# Patient Record
Sex: Female | Born: 1975 | Hispanic: Yes | Marital: Married | Smoking: Never smoker
Health system: Southern US, Community
[De-identification: ages and names within clinical notes are randomized; demographics above are authoritative.]

## PROBLEM LIST (undated history)

## (undated) HISTORY — PX: BREAST SURGERY: SHX581

## (undated) HISTORY — PX: TUBAL LIGATION: SHX77

---

## 2015-05-28 DIAGNOSIS — Z531 Procedure and treatment not carried out because of patient's decision for reasons of belief and group pressure: Secondary | ICD-10-CM | POA: Insufficient documentation

## 2015-05-28 DIAGNOSIS — IMO0001 Reserved for inherently not codable concepts without codable children: Secondary | ICD-10-CM | POA: Insufficient documentation

## 2016-02-12 ENCOUNTER — Ambulatory Visit
Admission: EM | Admit: 2016-02-12 | Discharge: 2016-02-12 | Disposition: A | Discharge status: Home / Self Care | Payer: BLUE CROSS/BLUE SHIELD | Attending: Family Medicine | Admitting: Family Medicine

## 2016-02-12 ENCOUNTER — Encounter: Payer: Self-pay | Admitting: Emergency Medicine

## 2016-02-12 DIAGNOSIS — N390 Urinary tract infection, site not specified: Secondary | ICD-10-CM | POA: Diagnosis not present

## 2016-02-12 LAB — URINALYSIS COMPLETE WITH MICROSCOPIC (ARMC ONLY): Squamous Epithelial / LPF: NONE SEEN

## 2016-02-12 MED ORDER — PHENAZOPYRIDINE HCL 200 MG PO TABS
200.0000 mg | ORAL_TABLET | Freq: Once | ORAL | Status: AC
  Administered 2016-02-12: 200 mg via ORAL

## 2016-02-12 MED ORDER — HYDROCODONE-ACETAMINOPHEN 5-325 MG PO TABS: 1.0000 | ORAL_TABLET | Freq: Four times a day (QID) | ORAL | 0 refills | Status: DC | PRN

## 2016-02-12 MED ORDER — CIPROFLOXACIN HCL 500 MG PO TABS
500.0000 mg | ORAL_TABLET | Freq: Once | ORAL | Status: AC
  Administered 2016-02-12: 500 mg via ORAL

## 2016-02-12 MED ORDER — CIPROFLOXACIN HCL 500 MG PO TABS: 500.0000 mg | ORAL_TABLET | Freq: Two times a day (BID) | ORAL | 0 refills | Status: DC

## 2016-02-12 NOTE — Discharge Instructions (Signed)
Pyridium over the counter as needed up to 3 times daily  Drink lots of water

## 2016-02-12 NOTE — ED Triage Notes (Signed)
Patient c/o burning when urinating and lower abdominal pain that started yesterday.

## 2016-02-12 NOTE — ED Provider Notes (Signed)
[facility]    CSN: [id number] Arrival date & time: 02/12/16  1344  First Provider Contact:  None       History   Chief Complaint Chief Complaint  Patient presents with  . Dysuria  . Abdominal Pain    HPI Sonya Rush is a 40 y.o. female.   The history is provided by the patient.  Dysuria  Pain quality:  Burning Pain severity:  Moderate Onset quality:  Sudden Duration:  2 days Timing:  Constant Progression:  Worsening Chronicity:  New Recent urinary tract infections: no   Relieved by:  Nothing Urinary symptoms: discolored urine and frequent urination   Urinary symptoms: no foul-smelling urine, no hematuria, no hesitancy and no bladder incontinence   Associated symptoms: abdominal pain (suprapubic)   Associated symptoms: no fever, no flank pain, no genital lesions, no nausea, no vaginal discharge and no vomiting   Risk factors: no hx of pyelonephritis, no hx of urolithiasis, no kidney transplant, not pregnant, no recurrent urinary tract infections, no renal cysts, no renal disease, not sexually active, no sexually transmitted infections, no single kidney and no urinary catheter     History reviewed. No pertinent past medical history.  There are no active problems to display for this patient.   Past Surgical History:  Procedure Laterality Date  . BREAST SURGERY    . TUBAL LIGATION      OB History    No data available       Home Medications    Prior to Admission medications   Medication Sig Start Date End Date Taking? Authorizing Provider  ciprofloxacin (CIPRO) 500 MG tablet Take 1 tablet (500 mg total) by mouth every 12 (twelve) hours. 02/12/16   Payton Mccallum, MD  HYDROcodone-acetaminophen (NORCO/VICODIN) 5-325 MG tablet Take 1-2 tablets by mouth every 6 (six) hours as needed. 02/12/16   Payton Mccallum, MD    Family History History reviewed. No pertinent family history.  Social History Social History  Substance Use Topics  . Smoking  status: Never Smoker  . Smokeless tobacco: Never Used  . Alcohol use Yes     Allergies   Iodine   Review of Systems Review of Systems  Constitutional: Negative for fever.  Gastrointestinal: Positive for abdominal pain (suprapubic). Negative for nausea and vomiting.  Genitourinary: Positive for dysuria. Negative for flank pain and vaginal discharge.     Physical Exam Triage Vital Signs ED Triage Vitals  Enc Vitals Group     BP 02/12/16 1431 (!) 180/100     Pulse Rate 02/12/16 1431 (!) 109     Resp 02/12/16 1431 16     Temp 02/12/16 1431 97.7 F (36.5 C)     Temp Source 02/12/16 1431 Tympanic     SpO2 02/12/16 1431 100 %     Weight 02/12/16 1431 148 lb (67.1 kg)     Height 02/12/16 1431 5\' 2"  (1.575 m)     Head Circumference --      Peak Flow --      Pain Score 02/12/16 1433 8     Pain Loc --      Pain Edu? --      Excl. in GC? --    No data found.   Updated Vital Signs BP 128/79 (BP Location: Left Arm)   Pulse 94   Temp 97.7 F (36.5 C) (Tympanic)   Resp 16   Ht 5\' 2"  (1.575 m)   Wt 148 lb (67.1 kg)   LMP 02/07/2016 (Exact  Date)   SpO2 100%   BMI 27.07 kg/m   Visual Acuity Right Eye Distance:   Left Eye Distance:   Bilateral Distance:    Right Eye Near:   Left Eye Near:    Bilateral Near:     Physical Exam  Constitutional: She appears well-developed and well-nourished. No distress.  Abdominal: Soft. Bowel sounds are normal. She exhibits no distension and no mass. There is tenderness (mild suprapubic ). There is no rebound and no guarding.  Skin: She is not diaphoretic.  Nursing note and vitals reviewed.    UC Treatments / Results  Labs (all labs ordered are listed, but only abnormal results are displayed) Labs Reviewed  URINE CULTURE - Abnormal; Notable for the following:       Result Value   Culture   (*)    Value: >=100,000 COLONIES/mL ESCHERICHIA COLI SUSCEPTIBILITIES TO FOLLOW Performed at [facility]    All other components  within normal limits  URINALYSIS COMPLETEWITH MICROSCOPIC ([facility] ONLY) - Abnormal; Notable for the following:    Color, Urine RED (*)    APPearance CLOUDY (*)    Glucose, UA   (*)    Value: TEST NOT REPORTED DUE TO COLOR INTERFERENCE OF URINE PIGMENT   Bilirubin Urine   (*)    Value: TEST NOT REPORTED DUE TO COLOR INTERFERENCE OF URINE PIGMENT   Ketones, ur   (*)    Value: TEST NOT REPORTED DUE TO COLOR INTERFERENCE OF URINE PIGMENT   Hgb urine dipstick   (*)    Value: TEST NOT REPORTED DUE TO COLOR INTERFERENCE OF URINE PIGMENT   Protein, ur   (*)    Value: TEST NOT REPORTED DUE TO COLOR INTERFERENCE OF URINE PIGMENT   Nitrite   (*)    Value: TEST NOT REPORTED DUE TO COLOR INTERFERENCE OF URINE PIGMENT   Leukocytes, UA   (*)    Value: TEST NOT REPORTED DUE TO COLOR INTERFERENCE OF URINE PIGMENT   Bacteria, UA RARE (*)    All other components within normal limits    EKG  EKG Interpretation None       Radiology No results found.  Procedures Procedures (including critical care time)  Medications Ordered in UC Medications  ciprofloxacin (CIPRO) tablet 500 mg (500 mg Oral Given 02/12/16 1557)  phenazopyridine (PYRIDIUM) tablet 200 mg (200 mg Oral Given 02/12/16 1558)     Initial Impression / Assessment and Plan / UC Course  I have reviewed the triage vital signs and the nursing notes.  Pertinent labs & imaging results that were available during my care of the patient were reviewed by me and considered in my medical decision making (see chart for details).  Clinical Course      Final Clinical Impressions(s) / UC Diagnoses   Final diagnoses:  UTI (lower urinary tract infection)    New Prescriptions Discharge Medication List as of 02/12/2016  4:09 PM    START taking these medications   Details  ciprofloxacin (CIPRO) 500 MG tablet Take 1 tablet (500 mg total) by mouth every 12 (twelve) hours., Starting Sun 02/12/2016, Normal    HYDROcodone-acetaminophen  (NORCO/VICODIN) 5-325 MG tablet Take 1-2 tablets by mouth every 6 (six) hours as needed., Starting Sun 02/12/2016, Print       1. diagnosis reviewed with patient 2. rx as per orders above; reviewed possible side effects, interactions, risks and benefits  3. Recommend supportive treatment with increased water 4. Follow-up prn if symptoms worsen or don't  improve   Payton Mccallumrlando Jquan Egelston, MD 02/14/16 2156

## 2016-02-14 ENCOUNTER — Telehealth: Payer: Self-pay

## 2016-02-14 NOTE — Telephone Encounter (Signed)
Courtesy call back completed today after patients visit at [facility]. Patient improved and will follow up with their PCP if symptoms continue or worsen.   

## 2016-02-15 LAB — URINE CULTURE

## 2018-12-04 ENCOUNTER — Other Ambulatory Visit: Payer: Self-pay

## 2018-12-04 DIAGNOSIS — Z20822 Contact with and (suspected) exposure to covid-19: Secondary | ICD-10-CM

## 2018-12-08 LAB — NOVEL CORONAVIRUS, NAA: SARS-CoV-2, NAA: NOT DETECTED

## 2019-08-15 DIAGNOSIS — U071 COVID-19: Secondary | ICD-10-CM

## 2019-08-15 HISTORY — DX: COVID-19: U07.1

## 2019-08-20 ENCOUNTER — Other Ambulatory Visit: Payer: Self-pay

## 2019-08-20 ENCOUNTER — Ambulatory Visit
Admission: EM | Admit: 2019-08-20 | Discharge: 2019-08-20 | Disposition: A | Discharge status: Home / Self Care | Payer: BC Managed Care – PPO

## 2019-08-20 ENCOUNTER — Ambulatory Visit (INDEPENDENT_AMBULATORY_CARE_PROVIDER_SITE_OTHER): Payer: BC Managed Care – PPO

## 2019-08-20 DIAGNOSIS — J1282 Pneumonia due to coronavirus disease 2019: Secondary | ICD-10-CM

## 2019-08-20 DIAGNOSIS — R05 Cough: Secondary | ICD-10-CM | POA: Diagnosis not present

## 2019-08-20 DIAGNOSIS — U071 COVID-19: Secondary | ICD-10-CM | POA: Diagnosis not present

## 2019-08-20 MED ORDER — HYDROCOD POLST-CPM POLST ER 10-8 MG/5ML PO SUER: 5.0000 mL | Freq: Two times a day (BID) | ORAL | 0 refills | Status: AC | PRN

## 2019-08-20 MED ORDER — IBUPROFEN 800 MG PO TABS
800.0000 mg | ORAL_TABLET | Freq: Once | ORAL | Status: AC
  Administered 2019-08-20: 16:00:00 800 mg via ORAL

## 2019-08-20 MED ORDER — PREDNISONE 50 MG PO TABS: 50.0000 mg | ORAL_TABLET | Freq: Every day | ORAL | 0 refills | Status: AC

## 2019-08-20 MED ORDER — ONDANSETRON 4 MG PO TBDP: 4.0000 mg | ORAL_TABLET | Freq: Three times a day (TID) | ORAL | 0 refills | Status: AC | PRN

## 2019-08-20 NOTE — Discharge Instructions (Addendum)
It was very nice seeing you today in clinic. Thank you for entrusting me with your care.   REST and Stay HYDRATED. Water and electrolyte containing beverages (Gatorade, Pedialyte) are best to prevent dehydration and electrolyte abnormalities. Make efforts to remain active. Deep breathe often to prevent worsening of your pneumonia. Use steroids, cough medication, and nausea medications as prescribed. Remain on quarantine as previously directed.   Make arrangements to follow up with your regular doctor in 1 week for re-evaluation if not improving. If your symptoms/condition worsens, please seek follow up care either here or in the ER. Please remember, our [facility] providers are "right here with you" when you need [place].   Again, it was my pleasure to take care of you today. Thank you for choosing our clinic. I hope that you start to feel better quickly.   Quentin Mulling, MSN, APRN, FNP-C, CEN Advanced Practice Provider  [facility]

## 2019-08-20 NOTE — ED Triage Notes (Signed)
Pt presents with c/o recent positive COVID (08/15/19), she was tested at a drive-thru facility. Pt has c/o cough, body aches, headache and trouble taking deep breaths. Pt is concerned about pna.

## 2019-08-21 NOTE — ED Provider Notes (Signed)
[place], [place]   Name: Sonya Rush DOB: 05-Nov-1975 MRN: [id number] CSN: [id number] PCP: Patient, No Pcp Per  Arrival date and time:  08/20/19 1450  Chief Complaint:  Cough and Shortness of Breath  NOTE: Prior to seeing the patient today, I have reviewed the triage nursing documentation and vital signs. Clinical staff has updated patient's PMH/PSHx, current medication list, and drug allergies/intolerances to ensure comprehensive history available to assist in medical decision making.   History:   HPI: Sonya Rush is a 44 y.o. female who presents today with complaints of worsening cough and shortness of breath after testing positive for SARS-CoV-2 (novel coronavirus) on 08/15/2019. Patient was tested at a drive-thru facility in [place], [place]. Since her diagnosis, patient has experienced the typical symptom constellation seen with this virus. She complaints of fatigue, cough, generalized myalgias, nausea, vomiting, and diarrhea. Patient notes that her cough is intermittently productive of yellow sputum. She denies any associated wheezing. Patient confirms altered sense of both taste and smell perception; anosmia and ageusia. She has had subjective fevers; has not measure temperature at home. She presents to urgent care today febrile to a Tmax of 102.2. Patient presents today reporting that her cough and shortness of breath have markedly increased since the onset of her symptoms. She is not sleeping. Patient feels as if she is having difficulties taking a deep breath. Oxygen saturations 97% on RA today in clinic. Despite her symptoms, patient has not taken any over the counter interventions to help improve/relieve her reported symptoms at home.   Past Medical History:  Diagnosis Date  . COVID-19 08/15/2019    Past Surgical History:  Procedure Laterality Date  . BREAST SURGERY    . TUBAL LIGATION      Family History  Problem Relation Age of Onset  . Healthy Mother   . Stroke Father      Social History   Tobacco Use  . Smoking status: Never Smoker  . Smokeless tobacco: Never Used  Substance Use Topics  . Alcohol use: Yes  . Drug use: No    Patient Active Problem List   Diagnosis Date Noted  . Refusal of blood transfusions as patient is Jehovah's Witness 05/28/2015    Home Medications:    Current Meds  Medication Sig  . acetaminophen (TYLENOL) 500 MG tablet Take 500 mg by mouth every 6 (six) hours as needed.    Allergies:   Iodine  Review of Systems (ROS):  Review of systems NEGATIVE unless otherwise noted in narrative H&P section.   Vital Signs: Today's Vitals   08/20/19 1507 08/20/19 1508 08/20/19 1602  BP:  117/70   Pulse:  99   Resp:  18   Temp:  (!) 102.2 F (39 C)   TempSrc:  Oral   SpO2:  97%   Weight:  150 lb (68 kg)   Height:  5\' 2"  (1.575 m)   PainSc: 8   8     Physical Exam: Physical Exam  Constitutional: She is oriented to person, place, and time and well-developed, well-nourished, and in no distress.  Acutely ill appearing; fatigued/listless.  HENT:  Head: Normocephalic and atraumatic.  Eyes: Pupils are equal, round, and reactive to light.  Neck: No tracheal deviation present.  Cardiovascular: Normal rate, regular rhythm, normal heart sounds and intact distal pulses.  Pulmonary/Chest: Effort normal. She has decreased breath sounds (throughout). She has wheezes (slight expiratory). She has rhonchi (upper airways; clears with cough).  Significant cough noted in clinic. No SOB or  increased WOB. No distress. Able to speak in complete sentences without difficulties. SPO2 97% on RA.  Abdominal: Soft. Normal appearance and bowel sounds are normal. She exhibits no distension. There is no abdominal tenderness.  Musculoskeletal:     Cervical back: Normal range of motion and neck supple.  Neurological: She is alert and oriented to person, place, and time. Gait normal.  Skin: Skin is warm and dry. No rash noted. She is not diaphoretic.   Psychiatric: Memory, affect and judgment normal. Her mood appears anxious.  Nursing note and vitals reviewed.   Urgent Care Treatments / Results:   Orders Placed This Encounter  Procedures  . DG Chest 2 View    LABS: PLEASE NOTE: all labs that were ordered this encounter are listed, however only abnormal results are displayed. Labs Reviewed - No data to display  EKG: -None  RADIOLOGY: DG Chest 2 View  Result Date: 08/20/2019 CLINICAL DATA:  COVID pneumonia EXAM: CHEST - 2 VIEW COMPARISON:  None. FINDINGS: There is scattered bilateral hazy airspace opacities. There is no pneumothorax or significant pleural effusion. The heart size is normal. There is no acute osseous abnormality. IMPRESSION: Scattered hazy bilateral airspace opacities which can be seen in patients with an atypical infectious process such as viral pneumonia. Electronically Signed   By: Katherine Mantle M.D.   On: 08/20/2019 15:43    PROCEDURES: Procedures  MEDICATIONS RECEIVED THIS VISIT: Medications  ibuprofen (ADVIL) tablet 800 mg (800 mg Oral Given 08/20/19 1601)    PERTINENT CLINICAL COURSE NOTES/UPDATES:   Initial Impression / Assessment and Plan / Urgent Care Course:  Pertinent labs & imaging results that were available during my care of the patient were personally reviewed by me and considered in my medical decision making (see lab/imaging section of note for values and interpretations).  Sonya Rush is a 44 y.o. female who presents to [facility] today with complaints of Cough and Shortness of Breath  Patient acutely ill appearing (non-toxic) appearing in clinic today. She does not appear to be in any acute distress. She does not appear to be in any acute distress. Presenting symptoms (see HPI) and exam as documented above. Patient diagnosed with SARS-CoV-2 (novel coronavirus) on 08/15/2019. She presents today febrile with reports of worsening cough and shortness of breath. She has not been  employing any conservative OTC interventions at home. Will proceed as follows:   Febrile to 102.w in clinic. Complains of body aches and headache. Patient treated with 800 mg IBU.    Radiographs of the chest performed today revealed scattered BILATERAL opacities. Given current viral infection, the findings are consistent with SARS-CoV-2 pneumonia.   Discussed radiographs with patient. I discussed with her that her symptoms are secondary to a pneumonia that is viral in nature, thus antibiotics would not offer her any relief or improve her symptoms any faster than conservative symptomatic management. Patient does not have any significant PMH that would qualify her the monoclonal antibody (bamlanivimab) infusion. Discussed continued treatment efforts at home. She was advised to have a low threshold for seeking further care if not improving on the current interventions. Plans for home care as follows:   Discussed need to remain physically active, as well as pulmonary hygiene (TCDB) exercises that can be used at home to prevent worsening of her current PNA.    Given her chest tightness, cough, and wheezing, will prescribe a systemic steroid burst (50 mg daily x 5 days).    Cough is significant and preventing sleep.  Will send in a supply of Tussionex for PRN use. She was educated on the indications and associated side effects of this medication.    Discussed supportive care measures at home during acute phase of illness. Patient to rest as much as possible. She was encouraged to ensure adequate hydration (water and ORS) to prevent dehydration and electrolyte derangements. Prescription will also be sent in for a supply of ondansetron for patient to use on a PRN basis for her nausea. Patient may use APAP and/or IBU on an as needed basis for pain/fever.   Patient currently on quarantine due to her confirmed SARS-CoV-2 diagnosis. She has been provided with the necessary documentation for her employer and  does not require any further today per her report.   Discussed follow up with primary care physician in 1 week for re-evaluation, os sooner if needed. I have reviewed the follow up and strict return precautions for any new or worsening symptoms. Patient is aware of symptoms that would be deemed urgent/emergent, and would thus require further evaluation either here or in the emergency department. At the time of discharge, she verbalized understanding and consent with the discharge plan as it was reviewed with her. All questions were fielded by provider and/or clinic staff prior to patient discharge.    Final Clinical Impressions / Urgent Care Diagnoses:   Final diagnoses:  Pneumonia due to COVID-19 virus    New Prescriptions:  [place] Controlled Substance Registry consulted? Yes, I have consulted the [place] Controlled Substances Registry for this patient, and feel the risk/benefit ratio today is favorable for proceeding with this prescription for a controlled substance.  . Discussed use of controlled substance medication to treat her acute symptoms.  o Reviewed [place] STOP Act regulations  o Clinic does not refill controlled substances over the phone without face to face evaluation.  . Safety precautions reviewed.  o Medications should not be sold or taken with alcohol.  o Avoid use while working, driving, or operating heavy machinery.  o Side effects associated with the use of this particular medication reviewed. - Patient understands that this medication can cause CNS depression, increase her risk of falls, and even lead to overdose that may result in death, if used outside of the parameters that she and I discussed.  With all of this in mind, she knowingly accepts the risks and responsibilities associated with intended course of treatment, and elects to responsibly proceed as discussed.  Meds ordered this encounter  Medications  . ibuprofen (ADVIL) tablet 800 mg  . predniSONE (DELTASONE) 50 MG tablet      Sig: Take 1 tablet (50 mg total) by mouth daily for 5 days.    Dispense:  5 tablet    Refill:  0  . chlorpheniramine-HYDROcodone (TUSSIONEX PENNKINETIC ER) 10-8 MG/5ML SUER    Sig: Take 5 mLs by mouth every 12 (twelve) hours as needed for cough. Will causes drowsiness; NO DRIVING.    Dispense:  70 mL    Refill:  0  . ondansetron (ZOFRAN-ODT) 4 MG disintegrating tablet    Sig: Take 1 tablet (4 mg total) by mouth every 8 (eight) hours as needed.    Dispense:  15 tablet    Refill:  0    Recommended Follow up Care:  Patient encouraged to follow up with the following provider within the specified time frame, or sooner as dictated by the severity of her symptoms. As always, she was instructed that for any urgent/emergent care needs, she should seek care  either here or in the emergency department for more immediate evaluation.  Follow-up Information    PCP In 1 week.   Why: General reassessment of symptoms if not improving        NOTE: This note was prepared using Scientist, clinical (histocompatibility and immunogenetics) along with smaller Lobbyist. Despite my best ability to proofread, there is the potential that transcriptional errors may still occur from this process, and are completely unintentional.    Verlee Monte, NP 08/21/19 1806

## 2020-10-21 IMAGING — CR DG CHEST 2V
3 series · 3 of 3 positions shown · non-contrast
Comparison: None.

CLINICAL DATA: COVID pneumonia

EXAM:
CHEST - 2 VIEW

[chest pa]
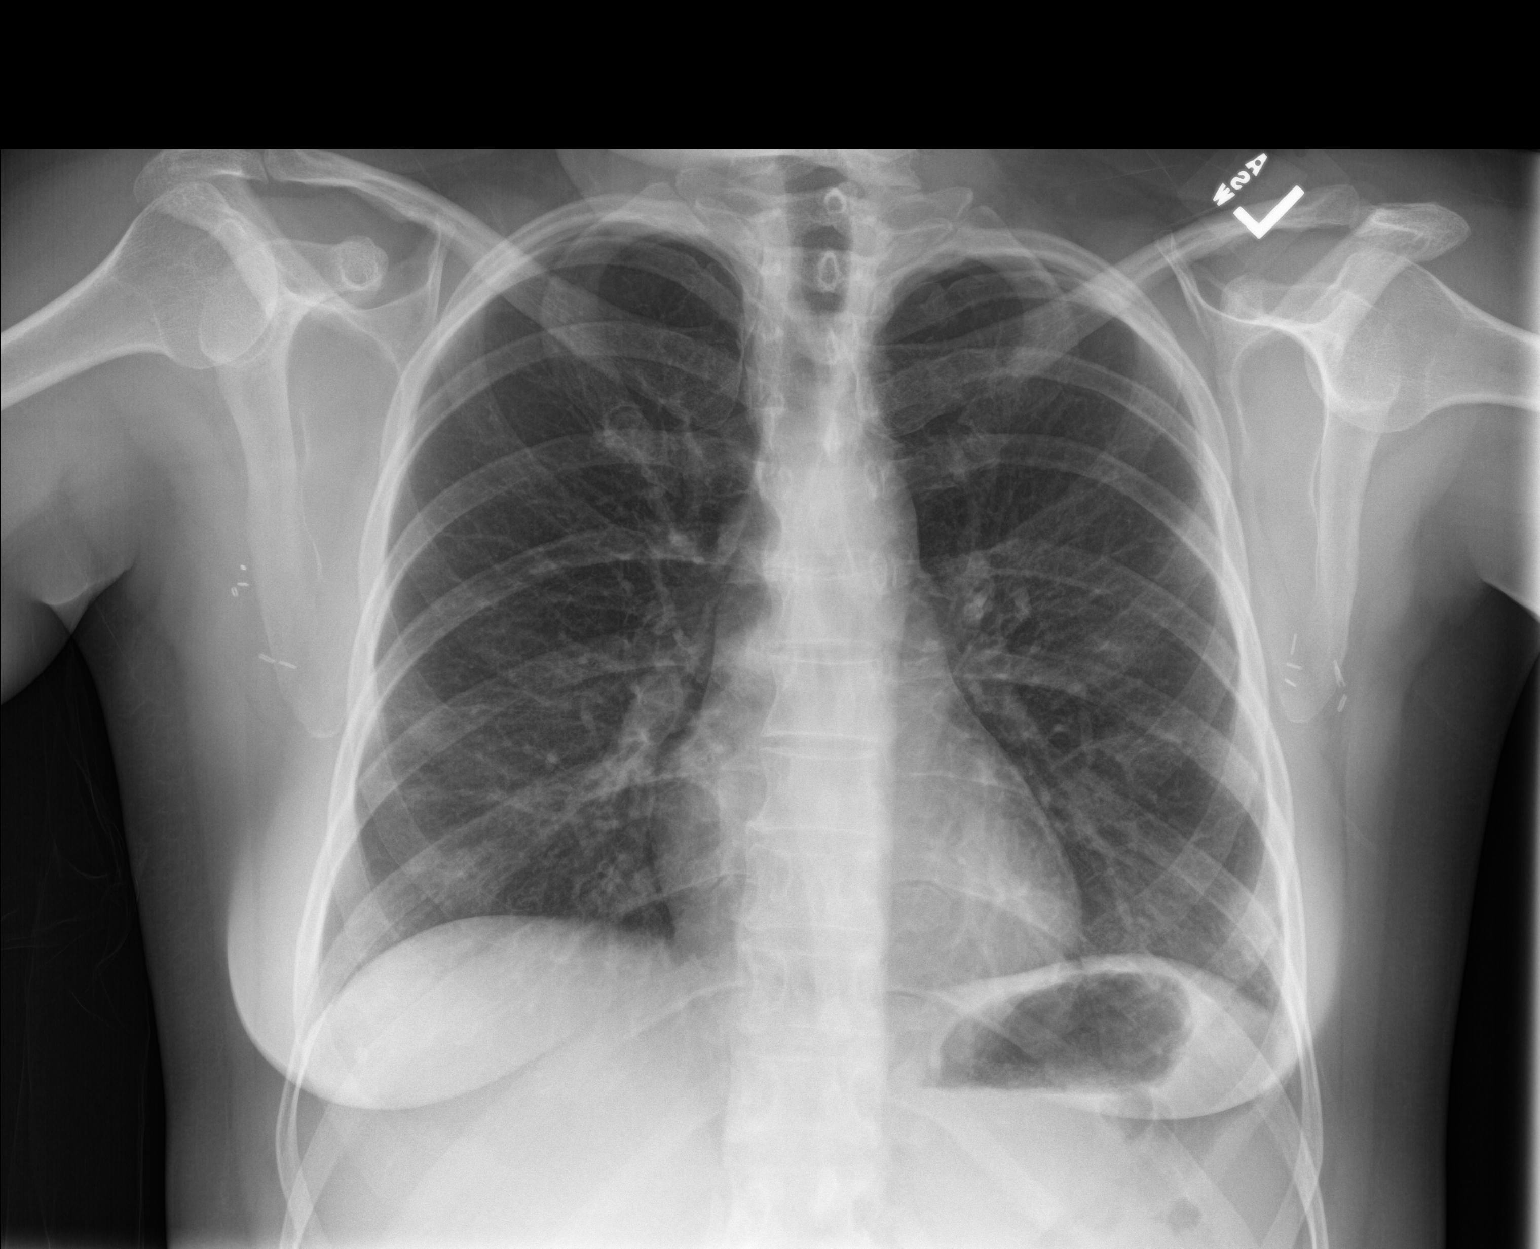

[chest lat (1 of 2)]
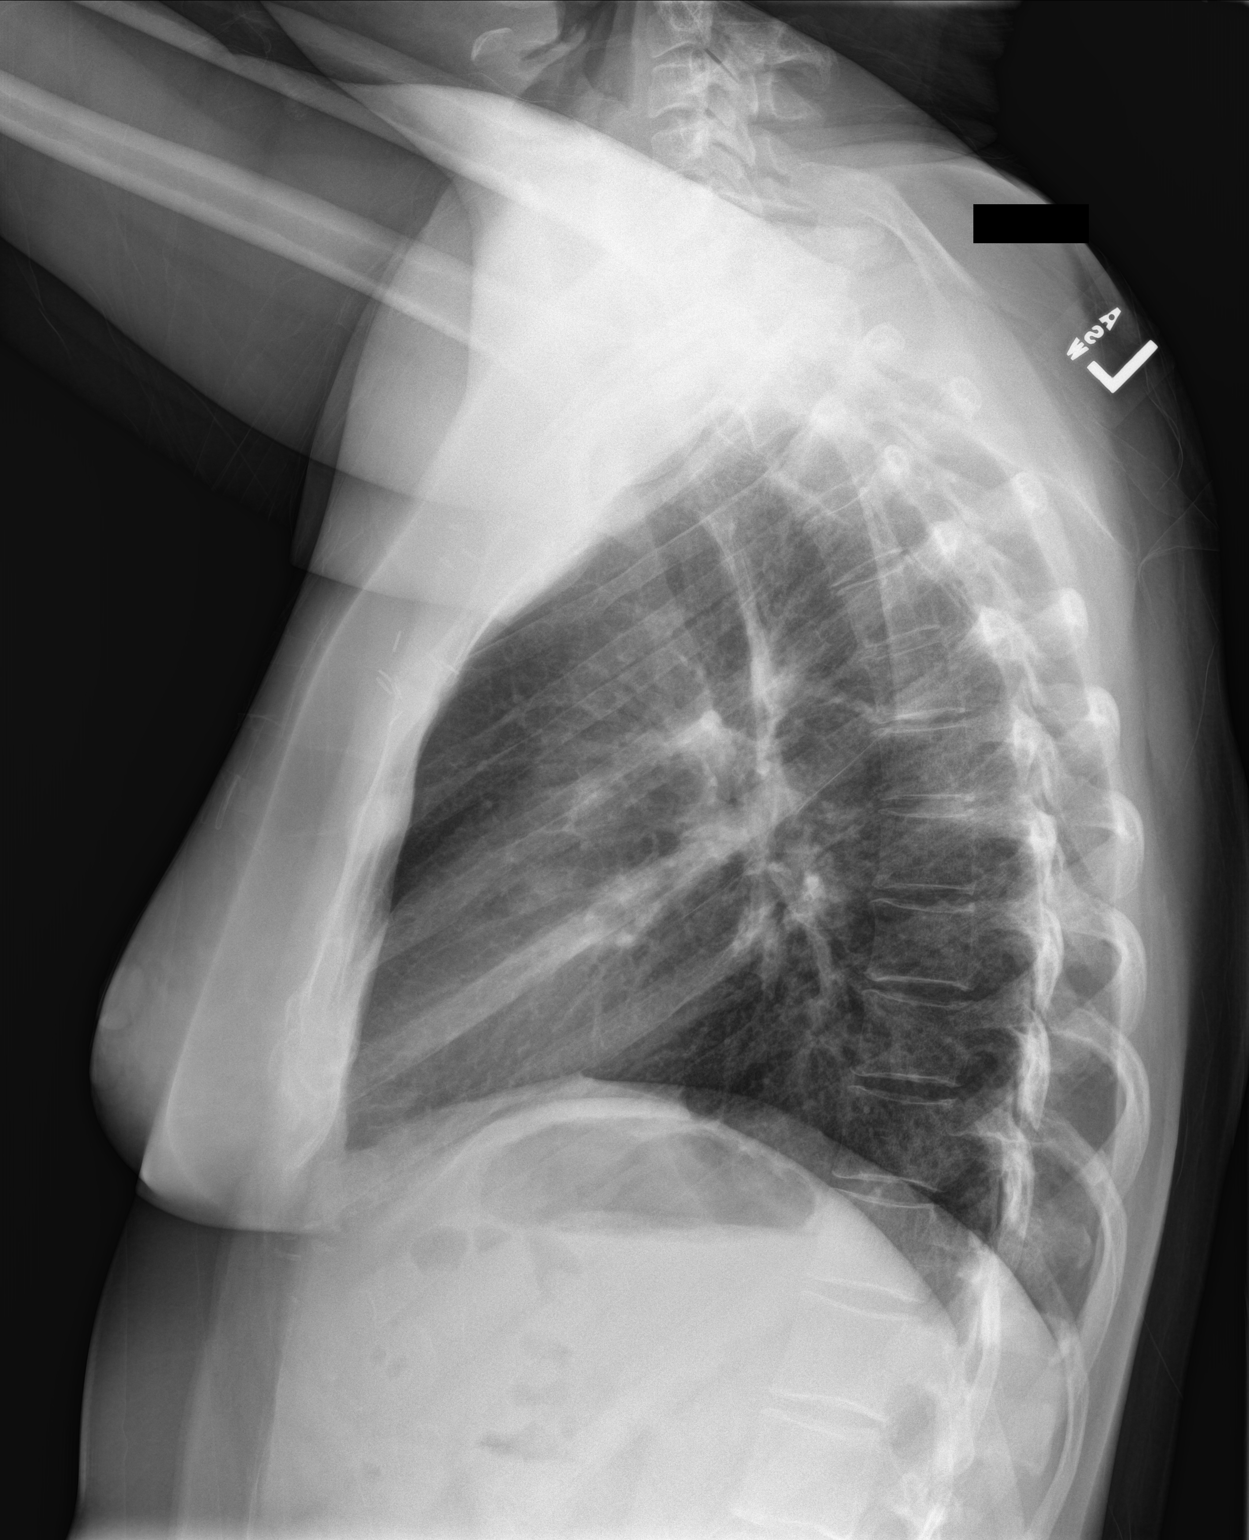

[chest lat (2 of 2)]
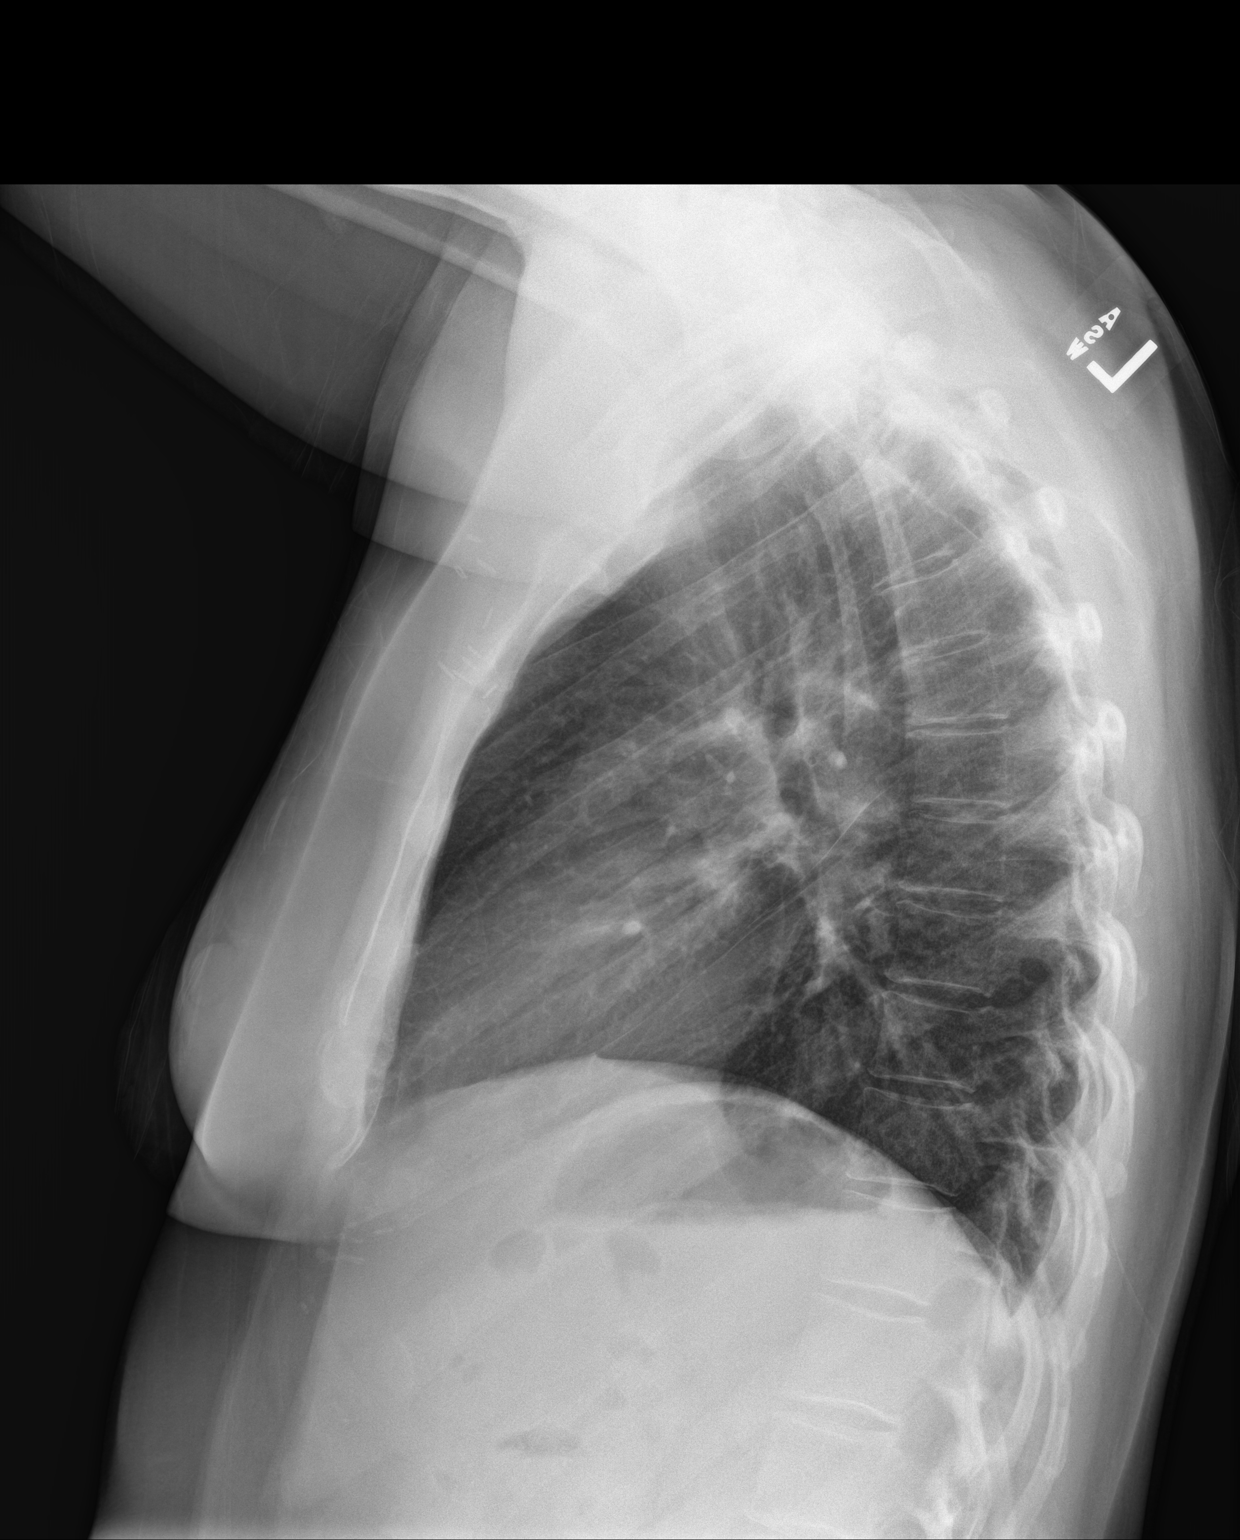

[3 of 3 positions shown; findings below may reference images not displayed]

FINDINGS: There is scattered bilateral hazy airspace opacities. There is no
pneumothorax or significant pleural effusion. The heart size is
normal. There is no acute osseous abnormality.
IMPRESSION: Scattered hazy bilateral airspace opacities which can be seen in
patients with an atypical infectious process such as viral
pneumonia.
